# Patient Record
Sex: Male | Born: 1955 | Race: White | Hispanic: No | Marital: Married
Health system: Southern US, Community
[De-identification: ages and names within clinical notes are randomized; demographics above are authoritative.]

---

## 2017-01-30 ENCOUNTER — Other Ambulatory Visit: Payer: Self-pay | Admitting: Physical Medicine and Rehabilitation

## 2017-01-30 DIAGNOSIS — M5136 Other intervertebral disc degeneration, lumbar region: Secondary | ICD-10-CM

## 2017-02-05 ENCOUNTER — Ambulatory Visit
Admission: RE | Admit: 2017-02-05 | Discharge: 2017-02-05 | Disposition: A | Discharge status: Home / Self Care | Payer: No Typology Code available for payment source | Source: Ambulatory Visit | Attending: Physical Medicine and Rehabilitation | Admitting: Physical Medicine and Rehabilitation

## 2017-02-05 DIAGNOSIS — M5136 Other intervertebral disc degeneration, lumbar region: Secondary | ICD-10-CM

## 2017-02-05 IMAGING — XA DG EPIDURAL/NERVE ROOT
1 series · 1 of 1 positions shown · IV contrast (isovue)
Comparison: Outside MRI

CLINICAL DATA: Spondylosis without myelopathy. Right leg pain. Good
response to previous right S1 injections. Right S1 injection
specifically requested.

EXAM:
Right S1 nerve root block and trans foraminal epidural
TECHNIQUE: The overlying skin was prepped with Betadine and draped in sterile
fashion. Skin anesthesia was carried [DATE]% Lidocaine. A
curved 22 gauge spinal needle was directed into the right S1 neural
foramen. I had more difficulty than usual localizing the right S1
foramen and, I think due to overlying bowel gas which may be anatomy
difficult to interpret. With persistence, right S1 neural foramen
was entered.Injection of a few drops of Isovue 200 demonstrates
spread outlining the right S1 nerve root. No intravascular
extension. 120mg Depo-Medrol and 2cc 1% Lidocaine were subsequently
administered. No apparent complication. The patient tolerated the
procedure without difficulty and was transferred to recovery in
excellent condition.
FLUOROSCOPY TIME:  3 minutes 1 second. 535.23 micro gray meter
squared

[Series 2: ortho standard · 1 of 1 slices shown]
[im 1/1]
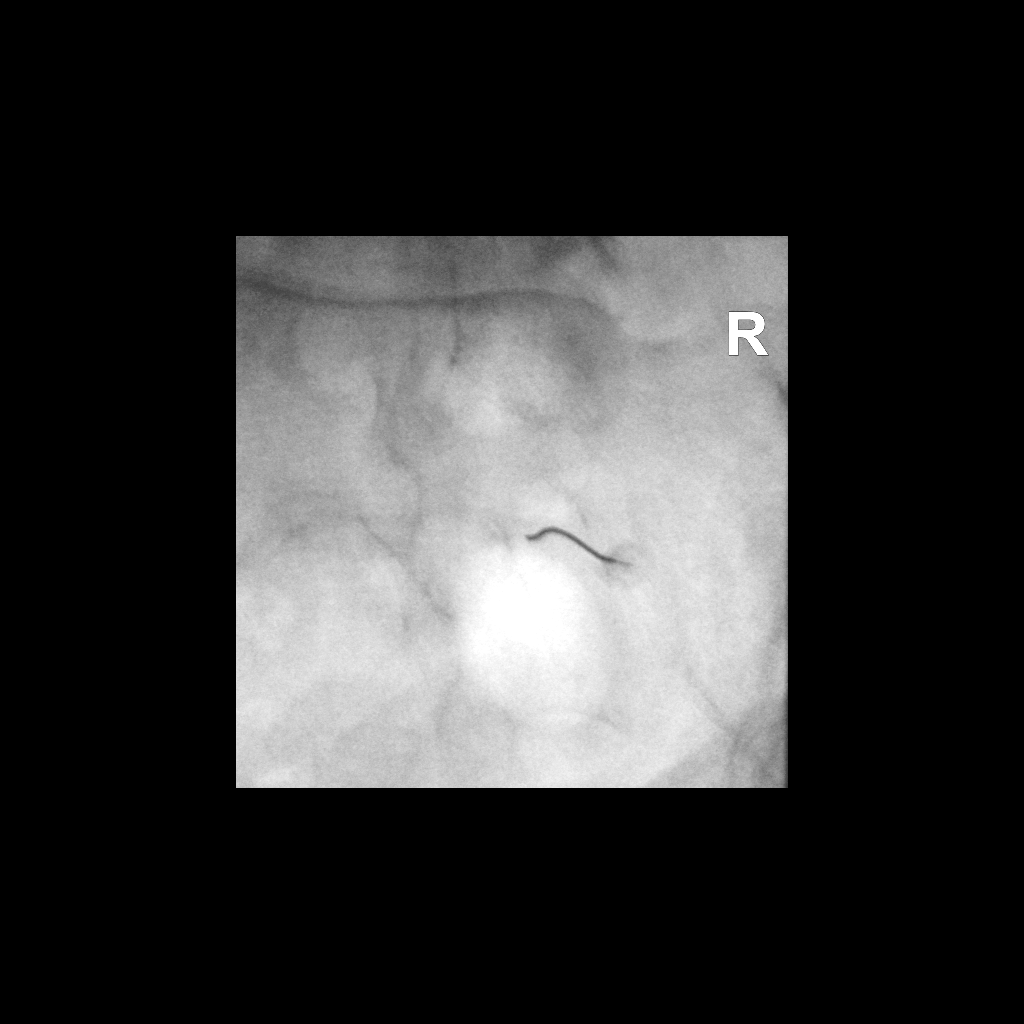

[1 of 1 positions shown; findings below may reference images not displayed]

IMPRESSION: Technically successful right S1 selective nerve root block and
transforaminal epidural steroid injection.

## 2017-02-05 MED ORDER — IOPAMIDOL (ISOVUE-M 200) INJECTION 41%
1.0000 mL | Freq: Once | INTRAMUSCULAR | Status: AC
  Administered 2017-02-05: 1 mL via EPIDURAL

## 2017-02-05 MED ORDER — METHYLPREDNISOLONE ACETATE 40 MG/ML INJ SUSP (RADIOLOG
120.0000 mg | Freq: Once | INTRAMUSCULAR | Status: AC
  Administered 2017-02-05: 120 mg via EPIDURAL

## 2017-02-05 NOTE — Discharge Instructions (Signed)

## 2021-01-04 ENCOUNTER — Other Ambulatory Visit: Payer: Self-pay

## 2021-11-30 DEATH — deceased
